# Patient Record
Sex: Male | Born: 1960 | Race: Black or African American | Hispanic: No | Marital: Single | Smoking: Never smoker
Health system: Southern US, Community
[De-identification: ages and names within clinical notes are randomized; demographics above are authoritative.]

---

## 2017-10-28 DEATH — deceased

## 2019-11-06 ENCOUNTER — Emergency Department (INDEPENDENT_AMBULATORY_CARE_PROVIDER_SITE_OTHER)
Admission: EM | Admit: 2019-11-06 | Discharge: 2019-11-06 | Disposition: A | Discharge status: Home / Self Care | Payer: Medicare Other | Source: Home / Self Care

## 2019-11-06 ENCOUNTER — Other Ambulatory Visit: Payer: Self-pay

## 2019-11-06 ENCOUNTER — Emergency Department (INDEPENDENT_AMBULATORY_CARE_PROVIDER_SITE_OTHER): Payer: Medicare Other

## 2019-11-06 ENCOUNTER — Emergency Department: Admission: EM | Admit: 2019-11-06 | Discharge status: Absent without leave | Payer: Self-pay | Source: Home / Self Care

## 2019-11-06 DIAGNOSIS — R5381 Other malaise: Secondary | ICD-10-CM

## 2019-11-06 DIAGNOSIS — J069 Acute upper respiratory infection, unspecified: Secondary | ICD-10-CM

## 2019-11-06 DIAGNOSIS — J209 Acute bronchitis, unspecified: Secondary | ICD-10-CM

## 2019-11-06 DIAGNOSIS — R3129 Other microscopic hematuria: Secondary | ICD-10-CM

## 2019-11-06 LAB — POCT CBC W AUTO DIFF (K'VILLE URGENT CARE)

## 2019-11-06 LAB — POCT URINALYSIS DIP (MANUAL ENTRY)
Bilirubin, UA: NEGATIVE
Glucose, UA: NEGATIVE mg/dL
Ketones, POC UA: NEGATIVE mg/dL
Leukocytes, UA: NEGATIVE
Nitrite, UA: NEGATIVE
Protein Ur, POC: NEGATIVE mg/dL
Spec Grav, UA: 1.02 (ref 1.010–1.025)
Urobilinogen, UA: 0.2 E.U./dL
pH, UA: 7 (ref 5.0–8.0)

## 2019-11-06 LAB — POCT FASTING CBG KUC MANUAL ENTRY: POCT Glucose (KUC): 77 mg/dL (ref 70–99)

## 2019-11-06 MED ORDER — DOXYCYCLINE HYCLATE 100 MG PO CAPS
100.0000 mg | ORAL_CAPSULE | Freq: Two times a day (BID) | ORAL | 0 refills | Status: AC
Start: 2019-11-06 — End: 2019-11-13

## 2019-11-06 NOTE — Discharge Instructions (Signed)
  Please take antibiotics as prescribed and be sure to complete entire course even if you start to feel better to ensure infection does not come back.  Call to schedule a follow up appointment with his primary care provider this week for recheck of urine and labs.  Call 911 or take him to the hospital if symptoms worsening- unable to keep down fluids, difficulty breathing, or other changes to his normal activity.   Due to concern for possibly having Covid-19, it is advised that you self-isolate at home until test results come back, usually 2-3 days.  If positive, it is recommended you stay isolated for at least 10 days after symptom onset and 24 hours after last fever without taking medication (whichever is longer).  If you MUST go out, please wear a mask at all times, limit contact with others.

## 2019-11-06 NOTE — ED Provider Notes (Signed)
[facility]    CSN: [id number] Arrival date & time: 11/06/19  1106      History   Chief Complaint Chief Complaint  Patient presents with  . Fatigue  . Anorexia    HPI Gary Douglas is a 59 y.o. male.   HPI  Gary Douglas is a 59 y.o. male brought to UC by cargiver with c/o fatigue, loss of appetite for about 2 weeks. His cargiver at the group home thinks he has "not been himself" since he was taken off HCTZ and put on amlodipine. He does not have f/u with PCP until October.  Caregiver notes "he dose not have his usual brightness in his eyes."  No fever, vomiting or diarrhea. No hx of UTIs. Mild cough and congestion. No known exposure to Covid.   History reviewed. No pertinent past medical history.  There are no problems to display for this patient.   History reviewed. No pertinent surgical history.     Home Medications    Prior to Admission medications   Medication Sig Start Date End Date Taking? Authorizing Provider  amLODipine (NORVASC) 2.5 MG tablet Take 2.5 mg by mouth daily.   Yes [provider]  aspirin EC 81 MG tablet Take 81 mg by mouth daily. Swallow whole.   Yes [provider]  atorvastatin (LIPITOR) 10 MG tablet Take 10 mg by mouth daily.   Yes [provider]  busPIRone (BUSPAR) 10 MG tablet Take 10 mg by mouth 3 (three) times daily.   Yes [provider]  carbamazepine (TEGRETOL XR) 400 MG 12 hr tablet Take 400 mg by mouth 2 (two) times daily.   Yes [provider]  famotidine (PEPCID) 40 MG tablet Take 40 mg by mouth daily.   Yes [provider]  FLUoxetine (PROZAC) 20 MG capsule Take 20 mg by mouth daily.   Yes [provider]  fluticasone (FLOVENT DISKUS) 50 MCG/BLIST diskus inhaler Inhale 1 puff into the lungs 2 (two) times daily.   Yes [provider]  fluvoxaMINE (LUVOX) 50 MG tablet Take 50 mg by mouth at bedtime.   Yes [provider]  haloperidol (HALDOL) 5  MG tablet Take 5 mg by mouth 2 (two) times daily.   Yes [provider]  Melatonin 10 MG CAPS Take by mouth.   Yes [provider]  risperiDONE (RISPERDAL) 1 MG tablet Take 1 mg by mouth at bedtime.   Yes [provider]  traZODone (DESYREL) 100 MG tablet Take 100 mg by mouth at bedtime.   Yes [provider]  doxycycline (VIBRAMYCIN) 100 MG capsule Take 1 capsule (100 mg total) by mouth 2 (two) times daily for 7 days. 11/06/19 11/13/19  Lurene Shadow, PA-C    Family History Family History  Problem Relation Age of Onset  . Cancer Father     Social History Social History   Tobacco Use  . Smoking status: Never Smoker  . Smokeless tobacco: Never Used  Substance Use Topics  . Alcohol use: Not Currently  . Drug use: Not on file     Allergies   Erythromycin   Review of Systems Review of Systems  Constitutional: Positive for fatigue. Negative for chills and fever.  HENT: Positive for congestion. Negative for ear pain, sore throat, trouble swallowing and voice change.   Respiratory: Positive for cough. Negative for shortness of breath.   Cardiovascular: Negative for chest pain and palpitations.  Gastrointestinal: Negative for abdominal pain, diarrhea, nausea and vomiting.  Musculoskeletal: Negative for arthralgias, back pain and myalgias.  Skin: Negative for rash.  All other systems reviewed and are negative.    Physical Exam Triage Vital Signs ED Triage Vitals [11/06/19 1213]  Enc Vitals Group     BP (!) 136/91     Pulse Rate 64     Resp 16     Temp 97.7 F (36.5 C)     Temp Source Oral     SpO2 100 %     Weight      Height      Head Circumference      Peak Flow      Pain Score      Pain Loc      Pain Edu?      Excl. in GC?    No data found.  Updated Vital Signs BP (!) 136/91 (BP Location: Right Arm)   Pulse 64   Temp 97.7 F (36.5 C) (Oral)   Resp 16   SpO2 100%   Visual Acuity Right Eye Distance:   Left Eye  Distance:   Bilateral Distance:    Right Eye Near:   Left Eye Near:    Bilateral Near:     Physical Exam Vitals and nursing note reviewed.  Constitutional:      Appearance: Normal appearance. He is well-developed.  HENT:     Head: Normocephalic and atraumatic.     Right Ear: Tympanic membrane and ear canal normal.     Left Ear: Tympanic membrane and ear canal normal.     Nose: Nose normal.     Mouth/Throat:     Mouth: Mucous membranes are moist.  Cardiovascular:     Rate and Rhythm: Normal rate and regular rhythm.  Pulmonary:     Effort: Pulmonary effort is normal. No respiratory distress.     Breath sounds: No stridor. Rhonchi (faint diffuse) present. No wheezing or rales.  Abdominal:     General: There is no distension.     Palpations: Abdomen is soft.     Tenderness: There is no abdominal tenderness. There is no right CVA tenderness or left CVA tenderness.  Musculoskeletal:        General: Normal range of motion.     Cervical back: Normal range of motion.  Skin:    General: Skin is warm and dry.  Neurological:     Mental Status: He is alert and oriented to person, place, and time.  Psychiatric:        Behavior: Behavior normal.      UC Treatments / Results  Labs (all labs ordered are listed, but only abnormal results are displayed) Labs Reviewed  COMPLETE METABOLIC PANEL WITH GFR - Abnormal; Notable for the following components:      Result Value   Sodium 132 (*)    Chloride 95 (*)    ALT 8 (*)    All other components within normal limits  POCT URINALYSIS DIP (MANUAL ENTRY) - Abnormal; Notable for the following components:   Blood, UA moderate (*)    All other components within normal limits  NOVEL CORONAVIRUS, NAA   Narrative:    Performed at:  [address] [place], [place]  [id number] Lab Director: Jolene Schimke MD, Phone:  [phone]  SARS-COV-2, NAA 2 DAY TAT   Narrative:    Performed at:  [address] [place] [address] [place] [address] [place], [place]  [id number] Lab Director: Jolene Schimke MD, Phone:  [phone]  POCT FASTING  CBG K[facility] MANUAL ENTRY  POCT CBC W AUTO DIFF ([facility])    EKG   Radiology CLINICAL DATA:  Cough, chest congestion and fatigue. Loss of appetite for the past 2 weeks.  EXAM: CHEST - 2 VIEW  COMPARISON:  None.  FINDINGS: Normal sized heart. Clear lungs with normal vascularity. Mild peribronchial thickening. Thoracic spine degenerative changes.  IMPRESSION: Mild bronchitic changes.   Electronically Signed   By: Beckie Salts M.D.   On: 11/06/2019 13:25  Procedures Procedures (including critical care time)  Medications Ordered in UC Medications - No data to display  Initial Impression / Assessment and Plan / UC Course  I have reviewed the triage vital signs and the nursing notes.  Pertinent labs & imaging results that were available during my care of the patient were reviewed by me and considered in my medical decision making (see chart for details).     Hx and exam c/w bronchitis Will start pt on doxycycline F/u PCP Discussed symptoms that warrant emergent care in the ED. AVS given  Final Clinical Impressions(s) / UC Diagnoses   Final diagnoses:  Malaise  URI with cough and congestion  Acute bronchitis, unspecified organism  Hematuria, microscopic     Discharge Instructions      Please take antibiotics as prescribed and be sure to complete entire course even if you start to feel better to ensure infection does not come back.  Call to schedule a follow up appointment with his primary care provider this week for recheck of urine and labs.  Call 911 or take him to the hospital if symptoms worsening- unable to keep down fluids, difficulty breathing, or other changes to his normal activity.   Due to concern for possibly having Covid-19, it is advised that you self-isolate at home until test results come back, usually 2-3 days.  If positive, it  is recommended you stay isolated for at least 10 days after symptom onset and 24 hours after last fever without taking medication (whichever is longer).  If you MUST go out, please wear a mask at all times, limit contact with others.     ED Prescriptions    Medication Sig Dispense Auth. Provider   doxycycline (VIBRAMYCIN) 100 MG capsule Take 1 capsule (100 mg total) by mouth 2 (two) times daily for 7 days. 14 capsule Lurene Shadow, [place]     PDMP not reviewed this encounter.   Lurene Shadow, [place] 11/09/19 [phone]

## 2019-11-06 NOTE — ED Triage Notes (Signed)
Patient presents to Urgent Care with complaints of fatigue and loss of appetite since 2 weeks ago. Patient's caregiver/ supervisor at the group home where he stays thinks he has "not been himself" since he was taken off his HCTZ and put on amlodipine. Pt is not able to speak for himself, can verbalize needs but is mostly nonverbal. Caregiver states "he does not have his usual brightness in his eyes".

## 2019-11-07 LAB — COMPLETE METABOLIC PANEL WITH GFR
AG Ratio: 1.4 (calc) (ref 1.0–2.5)
ALT: 8 U/L — ABNORMAL LOW (ref 9–46)
AST: 23 U/L (ref 10–35)
Albumin: 4.5 g/dL (ref 3.6–5.1)
Alkaline phosphatase (APISO): 91 U/L (ref 35–144)
BUN: 14 mg/dL (ref 7–25)
CO2: 28 mmol/L (ref 20–32)
Calcium: 9.1 mg/dL (ref 8.6–10.3)
Chloride: 95 mmol/L — ABNORMAL LOW (ref 98–110)
Creat: 1.07 mg/dL (ref 0.70–1.25)
GFR, Est African American: 87 mL/min/{1.73_m2} (ref >=60)
GFR, Est Non African American: 75 mL/min/{1.73_m2} (ref >=60)
Globulin: 3.2 g/dL (calc) (ref 1.9–3.7)
Glucose, Bld: 80 mg/dL (ref 65–99)
Potassium: 4.3 mmol/L (ref 3.5–5.3)
Sodium: 132 mmol/L — ABNORMAL LOW (ref 135–146)
Total Bilirubin: 0.5 mg/dL (ref 0.2–1.2)
Total Protein: 7.7 g/dL (ref 6.1–8.1)

## 2019-11-07 LAB — SARS-COV-2, NAA 2 DAY TAT

## 2019-11-07 LAB — NOVEL CORONAVIRUS, NAA: SARS-CoV-2, NAA: NOT DETECTED

## 2021-04-05 IMAGING — DX DG CHEST 2V
2 series · 2 of 2 positions shown · non-contrast
Comparison: None.

CLINICAL DATA: Cough, chest congestion and fatigue. Loss of
appetite for the past 2 weeks.

EXAM:
CHEST - 2 VIEW

[chest pa]
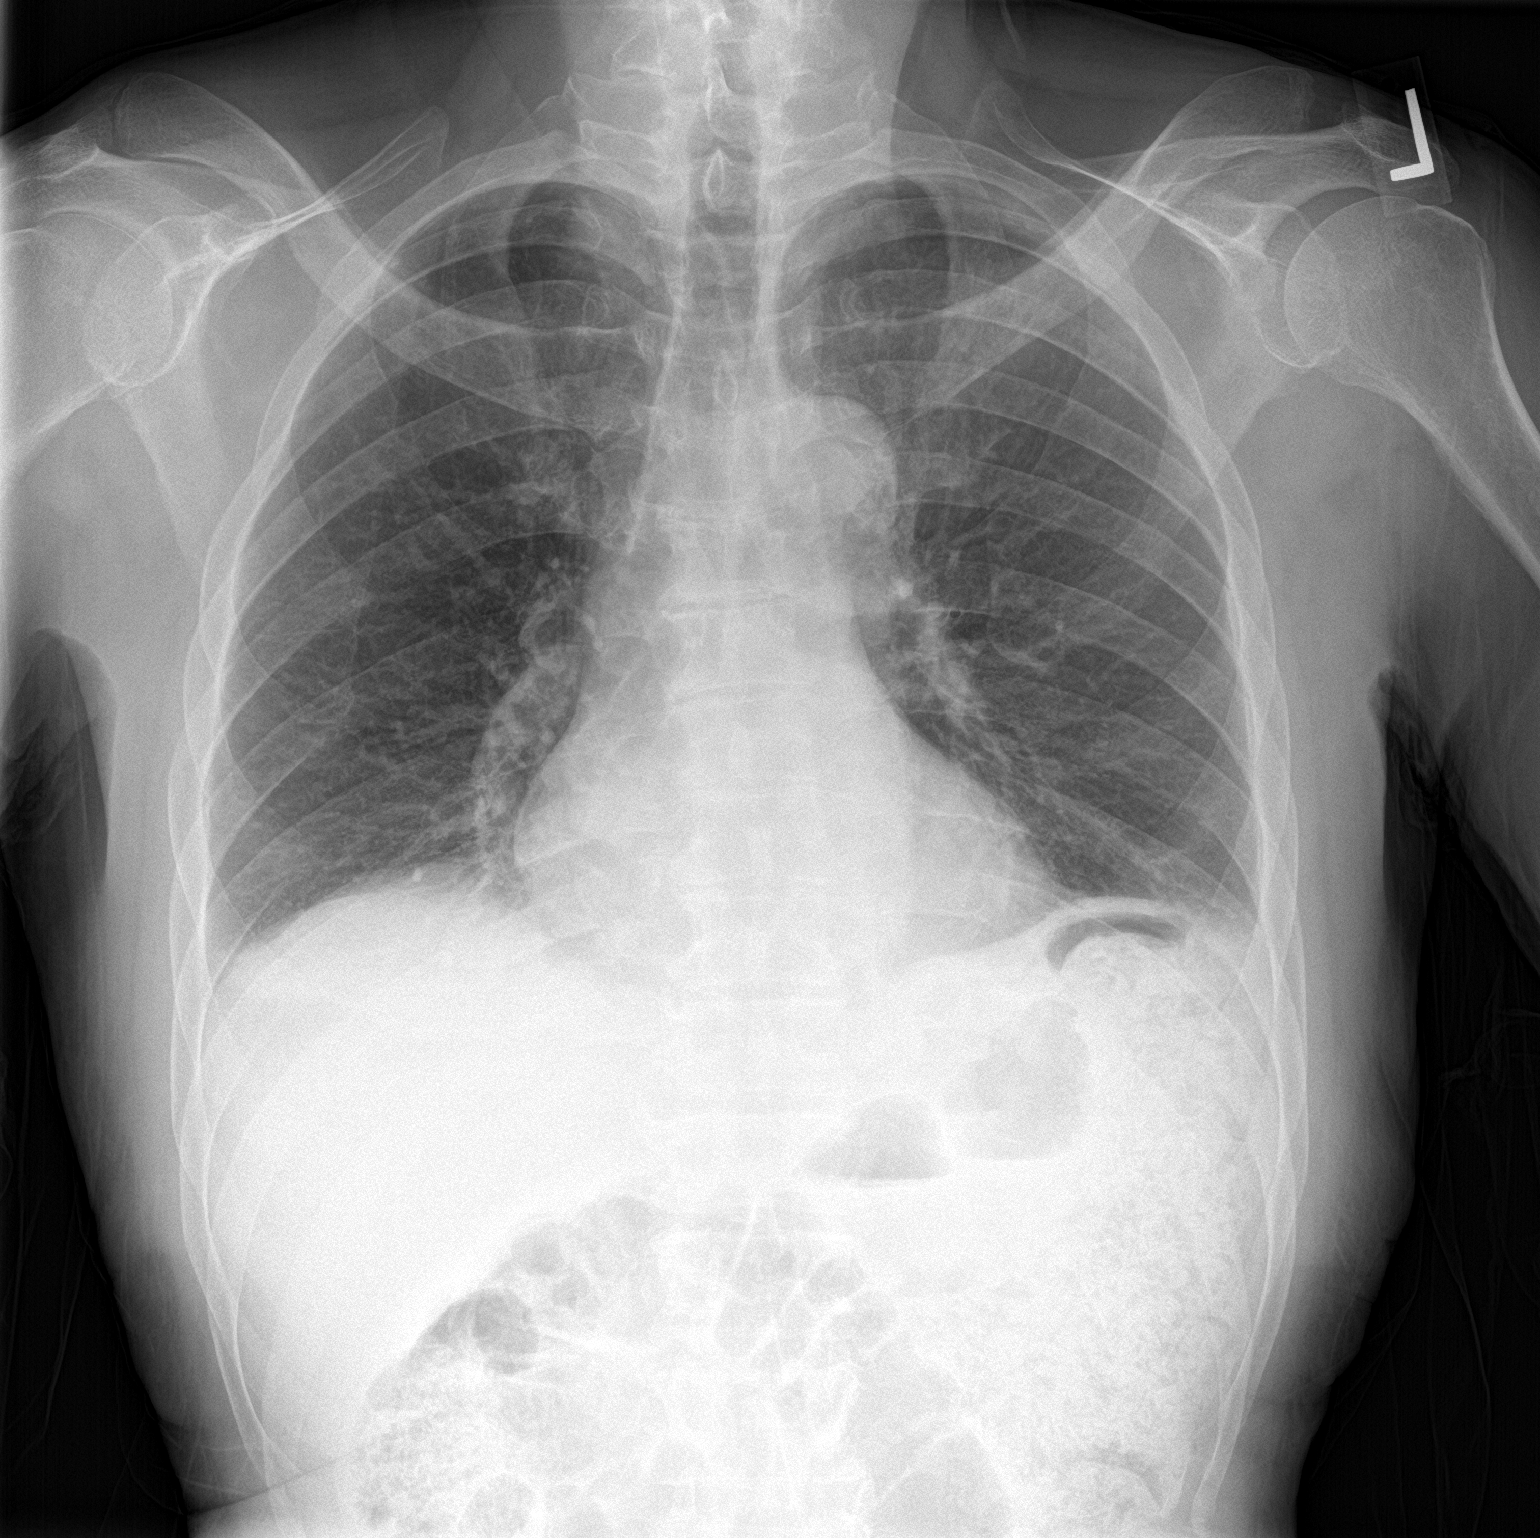

[chest lat]
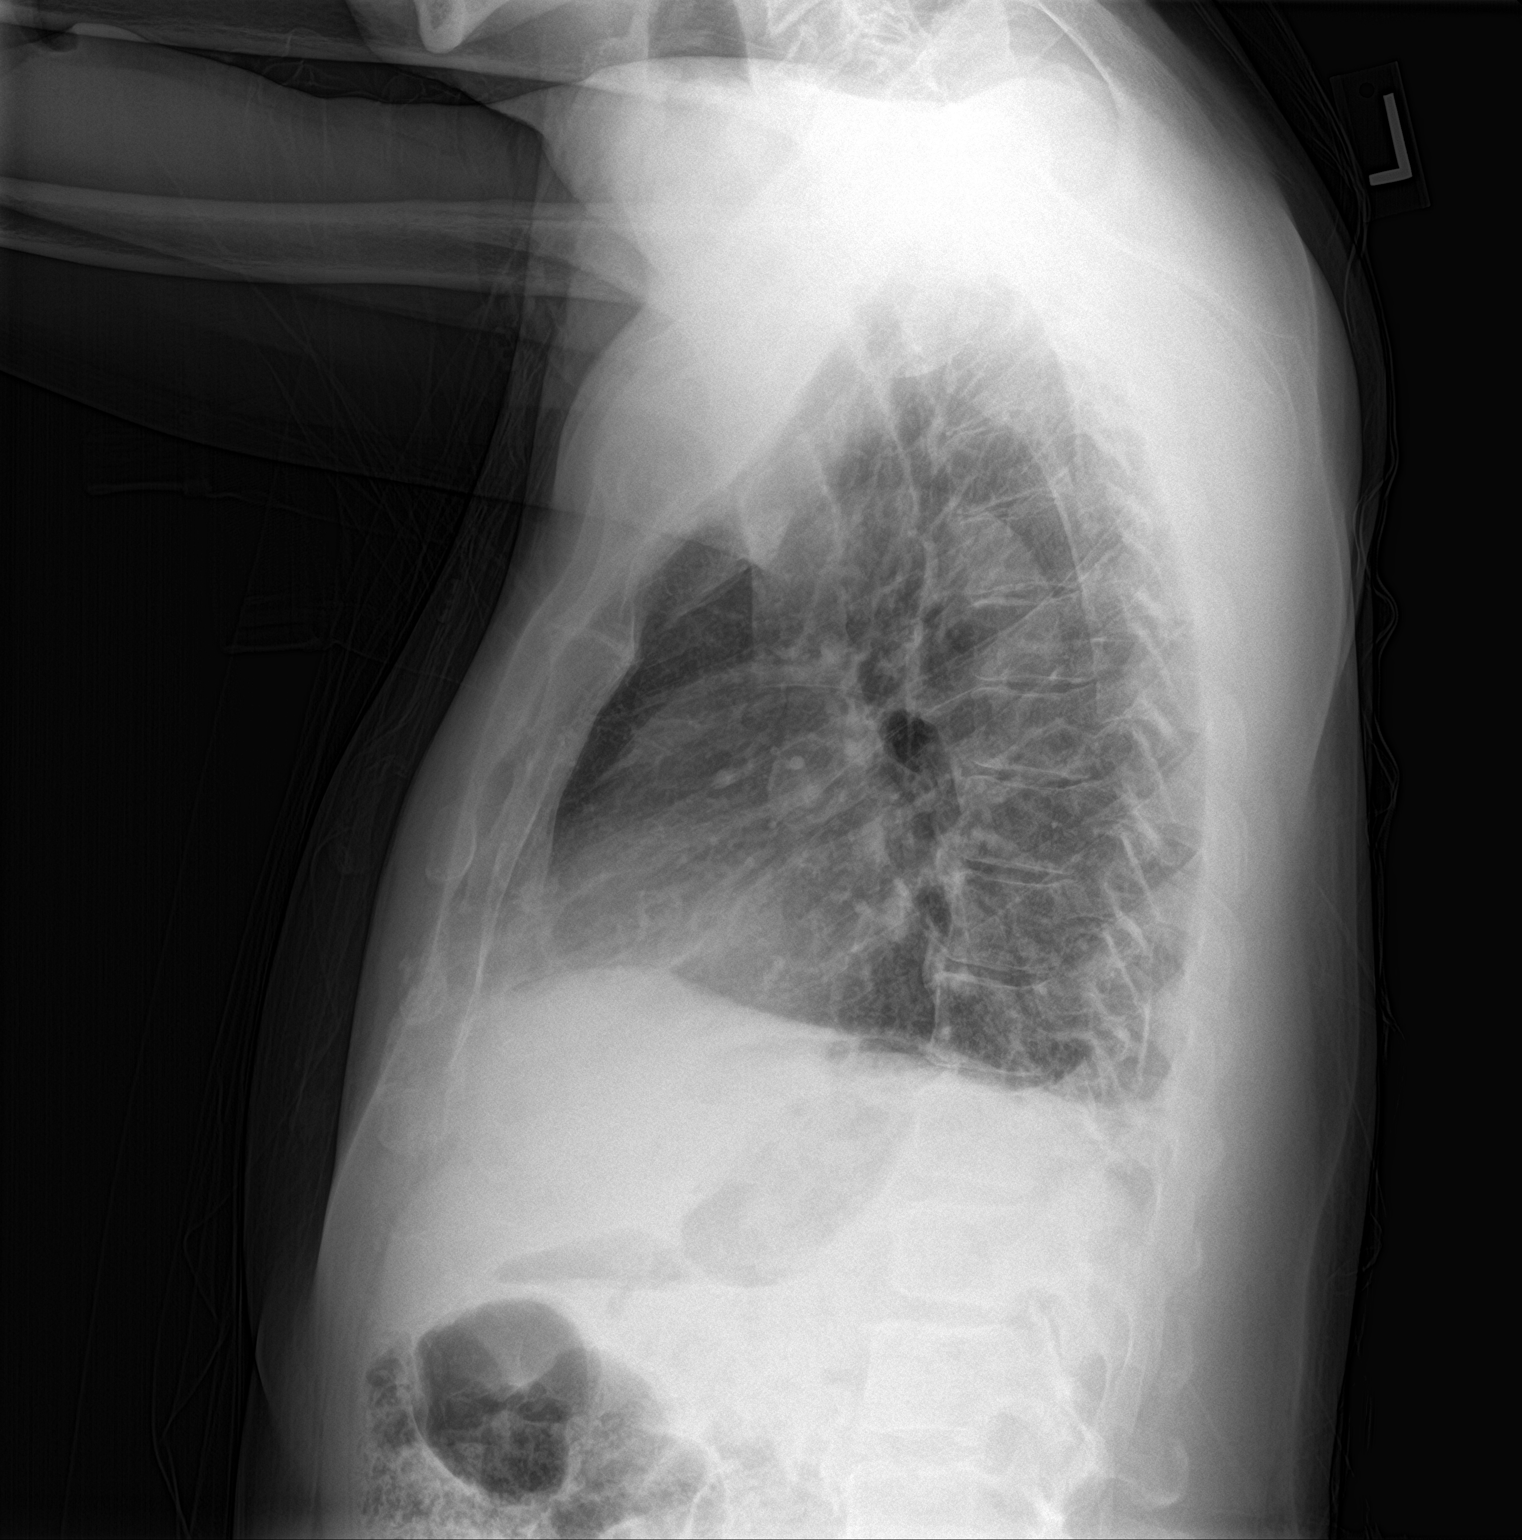

[2 of 2 positions shown; findings below may reference images not displayed]

FINDINGS: Normal sized heart. Clear lungs with normal vascularity. Mild
peribronchial thickening. Thoracic spine degenerative changes.
IMPRESSION: Mild bronchitic changes.
